# Patient Record
Sex: Male | Born: 1970
Health system: Southern US, Community
[De-identification: ages and names within clinical notes are randomized; demographics above are authoritative.]

## PROBLEM LIST (undated history)

## (undated) DIAGNOSIS — T7840XA Allergy, unspecified, initial encounter: Secondary | ICD-10-CM

## (undated) HISTORY — DX: Allergy, unspecified, initial encounter: T78.40XA

## (undated) HISTORY — PX: SHOULDER SURGERY: SHX246

---

## 2006-03-31 ENCOUNTER — Ambulatory Visit (HOSPITAL_BASED_OUTPATIENT_CLINIC_OR_DEPARTMENT_OTHER): Admission: RE | Admit: 2006-03-31 | Discharge: 2006-03-31 | Payer: Self-pay | Admitting: Orthopaedic Surgery

## 2014-08-06 ENCOUNTER — Ambulatory Visit (INDEPENDENT_AMBULATORY_CARE_PROVIDER_SITE_OTHER): Payer: 59 | Admitting: Family Medicine

## 2014-08-06 ENCOUNTER — Ambulatory Visit (INDEPENDENT_AMBULATORY_CARE_PROVIDER_SITE_OTHER): Payer: 59

## 2014-08-06 VITALS — BP 110/80 | HR 88 | Temp 98.7°F | Resp 18 | Ht 72.0 in | Wt 200.4 lb

## 2014-08-06 DIAGNOSIS — S92301A Fracture of unspecified metatarsal bone(s), right foot, initial encounter for closed fracture: Secondary | ICD-10-CM | POA: Diagnosis not present

## 2014-08-06 DIAGNOSIS — S99921A Unspecified injury of right foot, initial encounter: Secondary | ICD-10-CM | POA: Diagnosis not present

## 2014-08-06 DIAGNOSIS — M25571 Pain in right ankle and joints of right foot: Secondary | ICD-10-CM | POA: Diagnosis not present

## 2014-08-06 MED ORDER — HYDROCODONE-ACETAMINOPHEN 5-325 MG PO TABS: 1.0000 | ORAL_TABLET | Freq: Four times a day (QID) | ORAL | Status: AC | PRN

## 2014-08-06 NOTE — Patient Instructions (Addendum)
Rest, ice, elevate, compress with ace bandage - try to ice as freq as possible for the next 3 days - hopefully you can get into ortho early this week.  Ice 20 minutes every 2 hours at least.  You can use the crutches and not put weight on your foot.  When you go out, put on the short leg walking boot and you can put a small amount of weight on your foot as long as it is not to painful - listen to your body - we will have you rechecked with ortho this week.    Metatarsal Fracture  with Rehab A metatarsal fracture is a break (fracture) of one of the bones of the mid-foot (metatarsal bones). The metatarsal bones are responsible for maintaining the arch of the foot. There are three classifications of metatarsal fractures: dancer's fractures, Jones fractures, and stress fractures. A dancer's fracture is when a piece of bone is pulled off by a ligament or tendon (avulsion fracture) of the outer part of the foot (fifth metatarsal), near the joint with the ankle bones. A Jones fracture occurs in the middle of the fifth metatarsal. These fractures have limited ability to heal. A stress fracture occurs when the bone is slowly injured faster than it can repair itself. SYMPTOMS   Sharp pain, especially with standing or walking.  Tenderness, swelling, and later bruising (contusion) of the foot.  Numbness or paralysis from swelling in the foot, causing pressure on the blood vessels or nerves (uncommon). CAUSES  Fractures occur when a force is placed on the bone that is greater than it can handle. Common causes of injury include:  Direct hit (trauma) to the foot.  Twisting injury to the foot or ankle.  Landing on the foot and ankle in an improper position. RISK INCRESES WITH:  Participation in contact sports, sports that require jumping and landing, or sports in which cleats are worn and sliding occurs.  Previous foot or ankle sprains or dislocations.  Repeated injury to any joint in the foot.  Poor  strength and flexibility. PREVENTION  Warm up and stretch properly before an activity.  Allow for adequate recovery between workouts.  Maintain physical fitness in:  Strength, flexibility, and endurance.  Cardiovascular fitness.  When participating in jumping or contact sports, protect joints with supportive devices, such as wrapped elastic bandages, tape, braces, or high-top athletic shoes.  Wear properly fitted and padded protective equipment. PROGNOSIS If treated properly, metatarsal fractures usually heal well. Jones fractures have a higher risk of the bone failing to heal (nonunion). Sometimes, surgery is needed to heal Jones fractures.  RELATED COMPLICATIONS   Nonunion.  Fracture heals in a poor position (malunion).  Long-term (chronic) pain, stiffness, or swelling of the foot.  Excessive bleeding in the foot or at the dislocation site, causing pressure and injury to nerves and blood vessels (rare).  Unstable or arthritic joint following repeated injury or delayed treatment. TREATMENT  Treatment first involves the use of ice and medicine, to reduce pain and inflammation. If the bone fragments are out of alignment (displaced), then immediate realigning of the bones (reduction) is required. Fractures that cannot be realigned by hand, or where the bones protrude through the skin (open), may require surgery to hold the fracture in place with screws, pins, and plates. After the bones are in proper alignment, the foot and ankle must be restrained for 6 or more weeks. Restraint allows healing to occur. After restraint, it is important to perform strengthening and stretching  exercises to help regain strength and a full range of motion. These exercises may be completed at home or with a therapist. A stiff-soled shoe and arch support (orthotic) may be required when first returning to sports. MEDICATION   If pain medicine is needed, nonsteroidal anti-inflammatory medicines (NSAIDS), or  other minor pain relievers, are often advised.  Do not take pain medicine for 7 days before surgery.  Only take over-the-counter or prescription medicines for pain, fever, or discomfort as directed by your caregiver. COLD THERAPY  Cold treatment (icing) should be applied for 10 to 15 minutes every 2 to 3 hours for inflammations and pain, and immediately after activity that aggravates your symptoms. Use ice packs or ice massage. SEEK MEDICAL CARE IF:  Pain, tenderness, or swelling gets worse, despite treatment.  You experience pain, numbness, or coldness in the foot.  Blue, gray, or dark color appears in the toenails.  You or your child has an oral temperature above 102 F (38.9 C).  You have increased pain, swelling, and redness.  You have drainage of fluids or bleeding in the affected area.  New, unexplained symptoms develop. (Drugs used in treatment may produce side effects.) EXERCISES RANGE OF MOTION (ROM) AND STRETCHING EXERCISES - Metatarsal Fracture (including Jones and Dancer's Fractures) These exercises may help you when beginning to rehabilitate your injury. Your symptoms may resolve with or without further involvement from your physician, physical therapist, or athletic trainer. While completing these exercises, remember:   Restoring tissue flexibility helps normal motion to return to the joints. This allows healthier, less painful movement and activity.  An effective stretch should be held for at least 30 seconds. A stretch should never be painful. You should only feel a gentle lengthening or release in the stretched. RANGE OF MOTION - Dorsi/Plantar Flexion  While sitting with your right / left knee straight, draw the top of your foot upwards, by flexing your ankle. Then reverse the motion, pointing your toes downward.  Hold each position for __________ seconds.  After completing your first set of exercises, repeat this exercise with your knee bent. Repeat __________  times. Complete this exercise __________ times per day.  RANGE OF MOTION - Ankle Alphabet  Imagine your right / left big toe is a pen.  Keeping your hip and knee still, write out the entire alphabet with your "pen." Make the letters as large as you can, without increasing any discomfort. Repeat __________ times. Complete this exercise __________ times per day.  STRETCH - Gastroc, Standing  Place your hands on a wall.  Extend your right / left leg behind you, keeping the front knee somewhat bent.  Slightly point your toes inward on your back foot.  Keeping your right / left heel on the floor and your knee straight, shift your weight toward the wall, not allowing your back to arch.  You should feel a gentle stretch in the right / left calf. Hold this position for __________ seconds. Repeat __________ times. Complete this stretch __________ times per day. STRETCH - Soleus, Standing   Place your hands on a wall.  Extend your right / left leg behind you, keeping the other knee somewhat bent.  Slightly point your toes inward on your back foot.  Keep your right / left heel on the floor, bend your back knee, and slightly shift your weight over the back leg so that you feel a gentle stretch deep in your back calf.  Hold this position for __________ seconds. Repeat __________ times.  Complete this stretch __________ times per day. STRENGTHENING EXERCISES - Metatarsal Fracture (Including Jones and Dancer's Fractures) These exercises may help you when beginning to rehabilitate your injury. They may resolve your symptoms with or without further involvement from your physician, physical therapist, or athletic trainer. While completing these exercises, remember:   Muscles can gain both the endurance and the strength needed for everyday activities through controlled exercises.  Complete these exercises as instructed by your physician, physical therapist or athletic trainer. Increase the resistance  and repetitions only as guided by your caregiver. STRENGTH - Dorsiflexors  Secure a rubber exercise band or tubing to a fixed object (table, pole) and loop the other end around your right / left foot.  Sit on the floor facing the fixed object. The band should be slightly tense when your foot is relaxed.  Slowly draw your foot back toward you, using your ankle and toes.  Hold this position for __________ seconds. Slowly release the tension in the band and return your foot to the starting position. Repeat __________ times. Complete this exercise __________ times per day.  STRENGTH - Plantar-flexors   Sit with your right / left leg extended. Holding onto both ends of a rubber exercise band or tubing, loop it around the ball of your foot. Keep a slight tension in the band.  Slowly push your toes away from you, pointing them downward.  Hold this position for __________ seconds. Return slowly, controlling the tension in the band. Repeat __________ times. Complete this exercise __________ times per day.  STRENGTH - Plantar-flexors  Stand with your feet shoulder width apart. Steady yourself with a wall or table, using as little support as needed.  Keeping your weight evenly spread over the width of your feet, rise up on your toes.*  Hold this position for __________ seconds. Repeat __________ times. Complete this exercise __________ times per day.  *If this is too easy, shift your weight toward your right / left leg until you feel challenged. Ultimately, you may be asked to do this exercise while standing on your right / left foot only. STRENGTH - Towel Curls  Sit in a chair, on a non-carpeted surface.  Place your foot on a towel, keeping your heel on the floor.  Pull the towel toward your heel only by curling your toes. Keep your heel on the floor.  If instructed by your physician, physical therapist, or athletic trainer, weight may be added at the end of the towel. Repeat __________  times. Complete this exercise __________ times per day. STRENGTH - Ankle Eversion  Secure one end of a rubber exercise band or tubing to a fixed object (table, pole). Loop the other end around your foot, just before your toes.  Place your fists between your knees. This will focus your strengthening at your ankle.  Drawing the band across your opposite foot, away from the pole, slowly, pull your little toe out and up. Make sure the band is positioned to resist the entire motion.  Hold this position for __________ seconds.  Have your muscles resist the band, as it slowly pulls your foot back to the starting position. Repeat __________ times. Complete this exercise __________ times per day.  STRENGTH - Ankle Inversion  Secure one end of a rubber exercise band or tubing to a fixed object (table, pole). Loop the other end around your foot, just before your toes.  Place your fists between your knees. This will focus your strengthening at your ankle.  Slowly, pull  your big toe up and in, making sure the band is positioned to resist the entire motion.  Hold this position for __________ seconds.  Have your muscles resist the band, as it slowly pulls your foot back to the starting position. Repeat __________ times. Complete this exercises __________ times per day.  Document Released: 02/03/2005 Document Revised: 04/28/2011 Document Reviewed: 05/19/2013 [facility] Patient Information 2015 [place], [place]. This information is not intended to replace advice given to you by your health care provider. Make sure you discuss any questions you have with your health care provider.

## 2014-08-06 NOTE — Progress Notes (Addendum)
Subjective:   This chart was scribed for Norberto Sorenson, MD by Andrew Au, ED Scribe. This patient was seen in room 6 and the patient's care was started at 1:57 PM.   Patient ID: Kurt Mathews, male    DOB: [birth year], 44 y.o.   MRN: [id number]  HPI   Chief Complaint  Patient presents with  . Foot Injury    right foot injury & swelling-happened today around 12:45. Stepped off a curb wrong & into a drain.   HPI Comments:  Kurt Mathews is a 44 y.o. male who presents to the [facility] complaining of right foot injury that occurred 1 hour ago. Pt states he twisted his ankle stepping off a curb. Pt has applied ice to ankle since injury, which has reduced the pain, but has not been able to bear weight to right foot. Pt and his wife has been seen by Dr. Berneice Heinrich at [facility] orthopedist for past shoulder injury. Pt plans to follow up with Dr. Jerl Santos.   Past Medical History  Diagnosis Date  . Allergy    No Known Allergies Prior to Admission medications   Medication Sig Start Date End Date Taking? Authorizing Provider  fexofenadine (ALLEGRA) 180 MG tablet Take 180 mg by mouth daily.   Yes Historical Provider, MD    Review of Systems  Constitutional: Positive for activity change. Negative for fever and appetite change.  Respiratory: Negative for cough and shortness of breath.   Cardiovascular: Positive for leg swelling. Negative for chest pain.  Gastrointestinal: Negative for abdominal pain.  Musculoskeletal: Positive for myalgias, arthralgias and gait problem.  Skin: Negative for color change, pallor and wound.  Neurological: Negative for weakness and numbness.  Hematological: Negative for adenopathy. Does not bruise/bleed easily.   Objective:  Physical Exam  Constitutional: He is oriented to person, place, and time. He appears well-developed and well-nourished. No distress.  HENT:  Head: Normocephalic and atraumatic.  Eyes: Conjunctivae and EOM are normal.  Neck:  Neck supple.  Cardiovascular: Normal rate.   Pulses:      Dorsalis pedis pulses are 2+ on the right side.       Posterior tibial pulses are 2+ on the right side.  Pulmonary/Chest: Effort normal.  Musculoskeletal: Normal range of motion.  Large amount erythema and edema over proximal 5th metatarsal.   Neurological: He is alert and oriented to person, place, and time.  Skin: Skin is warm and dry.  Psychiatric: He has a normal mood and affect. His behavior is normal.  Nursing note and vitals reviewed.  Filed Vitals:   08/06/14 1343  BP: 110/80  Pulse: 88  Temp: 98.7 F (37.1 C)  TempSrc: Oral  Resp: 18  Height: 6' (1.829 m)  Weight: 200 lb 6 oz (90.89 kg)  SpO2: 99%   [facility] reading (PRIMARY) by Dr. Clelia Croft. Right foot Xray: displaced avulsion of proximal 5th metatarsal styloid. It does appear that styloid is displaced by 40mm Right ankle xray. Large amount of soft tissue evident.    Dg Ankle Complete Right  08/06/2014   CLINICAL DATA:  Rolling injury after stepping into hole  EXAM: RIGHT ANKLE - COMPLETE 3+ VIEW  COMPARISON:  None.  FINDINGS: Frontal, oblique, and lateral views were obtained. There is a displaced fracture at the base of the fifth metatarsal. There is soft tissue swelling. No other fracture apparent. No joint effusion. The ankle mortise appears intact.  IMPRESSION: Fracture of the base of the fifth metatarsal with proximal displacement  of the proximal fragment from the remainder of the fifth metatarsal. Soft tissue swelling. Ankle mortise appears intact. No other fracture apparent.   Electronically Signed   By: Bretta Bang III M.D.   On: 08/06/2014 14:55   Dg Foot Complete Right  08/06/2014   CLINICAL DATA:  Right foot pain after stepping in a hole outside today, resulting in an injury.  EXAM: RIGHT FOOT COMPLETE - 3+ VIEW  COMPARISON:  None.  FINDINGS: Transverse fracture of the base of the fifth metatarsal with proximal and lateral displacement of the proximal fragment.  Overlying soft tissue swelling.  IMPRESSION: Fracture of the base of the fifth metatarsal.   Electronically Signed   By: Beckie Salts M.D.   On: 08/06/2014 14:54    Assessment & Plan:   Proximal 5th metatarsal styloid avulsion but displaced  Will refer to orthopedics as it appears that they styloid fracture is displaced more than 3mm.  In the mean time will place in a short leg walking boot with crutches - . RICE and recheck with ortho this wk - pt saw Dr. Jerl Santos recently so would like to f/u w/ him.   1. Pain in joint, ankle and foot, right   2. Injury of right foot including toes, initial encounter   3. Fracture of fifth metatarsal bone, right, closed, initial encounter   4. Avulsion fracture of metatarsal bone, right, closed, initial encounter     Orders Placed This Encounter  Procedures  . DG Foot Complete Right    Standing Status: Future     Number of Occurrences: 1     Standing Expiration Date: 08/06/2015    Order Specific Question:  Reason for Exam (SYMPTOM  OR DIAGNOSIS REQUIRED)    Answer:  inversion injury with huge amount of swelling over proximal 5th metatarsal    Order Specific Question:  Preferred imaging location?    Answer:  External  . DG Ankle Complete Right    Standing Status: Future     Number of Occurrences: 1     Standing Expiration Date: 08/06/2015    Order Specific Question:  Reason for Exam (SYMPTOM  OR DIAGNOSIS REQUIRED)    Answer:  swelling and pain immed anterior and distal to lateral malleolus after inversion injury    Order Specific Question:  Preferred imaging location?    Answer:  External  . Ambulatory referral to Orthopedic Surgery    Referral Priority:  Urgent    Referral Type:  Surgical    Referral Reason:  Specialty Services Required    Requested Specialty:  Orthopedic Surgery    Number of Visits Requested:  1    Meds ordered this encounter  Medications  . fexofenadine (ALLEGRA) 180 MG tablet    Sig: Take 180 mg by mouth daily.  Marland Kitchen  HYDROcodone-acetaminophen (NORCO/VICODIN) 5-325 MG per tablet    Sig: Take 1 tablet by mouth every 6 (six) hours as needed for moderate pain.    Dispense:  30 tablet    Refill:  0    I personally performed the services described in this documentation, which was scribed in my presence. The recorded information has been reviewed and considered, and addended by me as needed.  Norberto Sorenson, MD MPH

## 2014-08-07 NOTE — Progress Notes (Signed)
Looks like pt has appt tomorrow w/ Dr. Nolen Mu. Don't need to try pt again. Thanks.

## 2014-08-08 DIAGNOSIS — S92353A Displaced fracture of fifth metatarsal bone, unspecified foot, initial encounter for closed fracture: Secondary | ICD-10-CM | POA: Insufficient documentation

## 2014-10-05 ENCOUNTER — Encounter: Payer: Self-pay | Admitting: *Deleted

## 2015-11-17 IMAGING — CR DG ANKLE COMPLETE 3+V*R*
3 series · 3 of 3 positions shown · non-contrast
Comparison: None.

CLINICAL DATA: Rolling injury after stepping into hole

EXAM:
RIGHT ANKLE - COMPLETE 3+ VIEW

[AP]
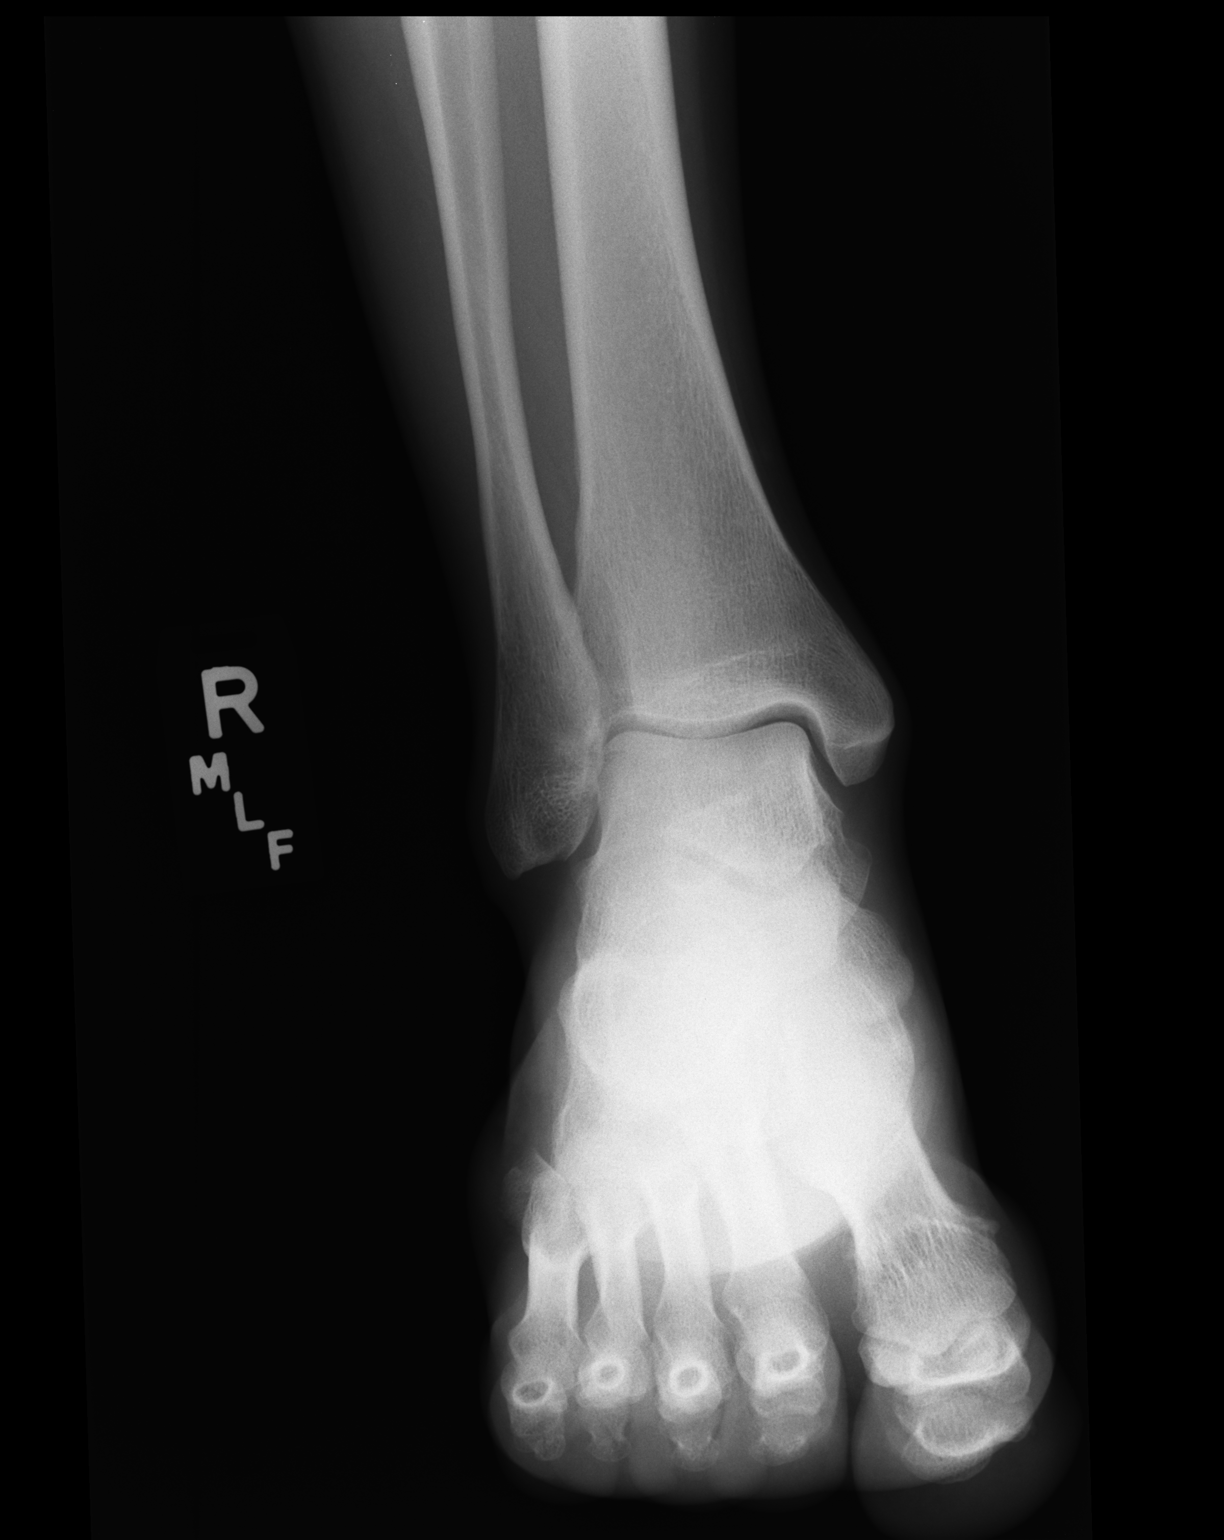

[ap obl int rot]
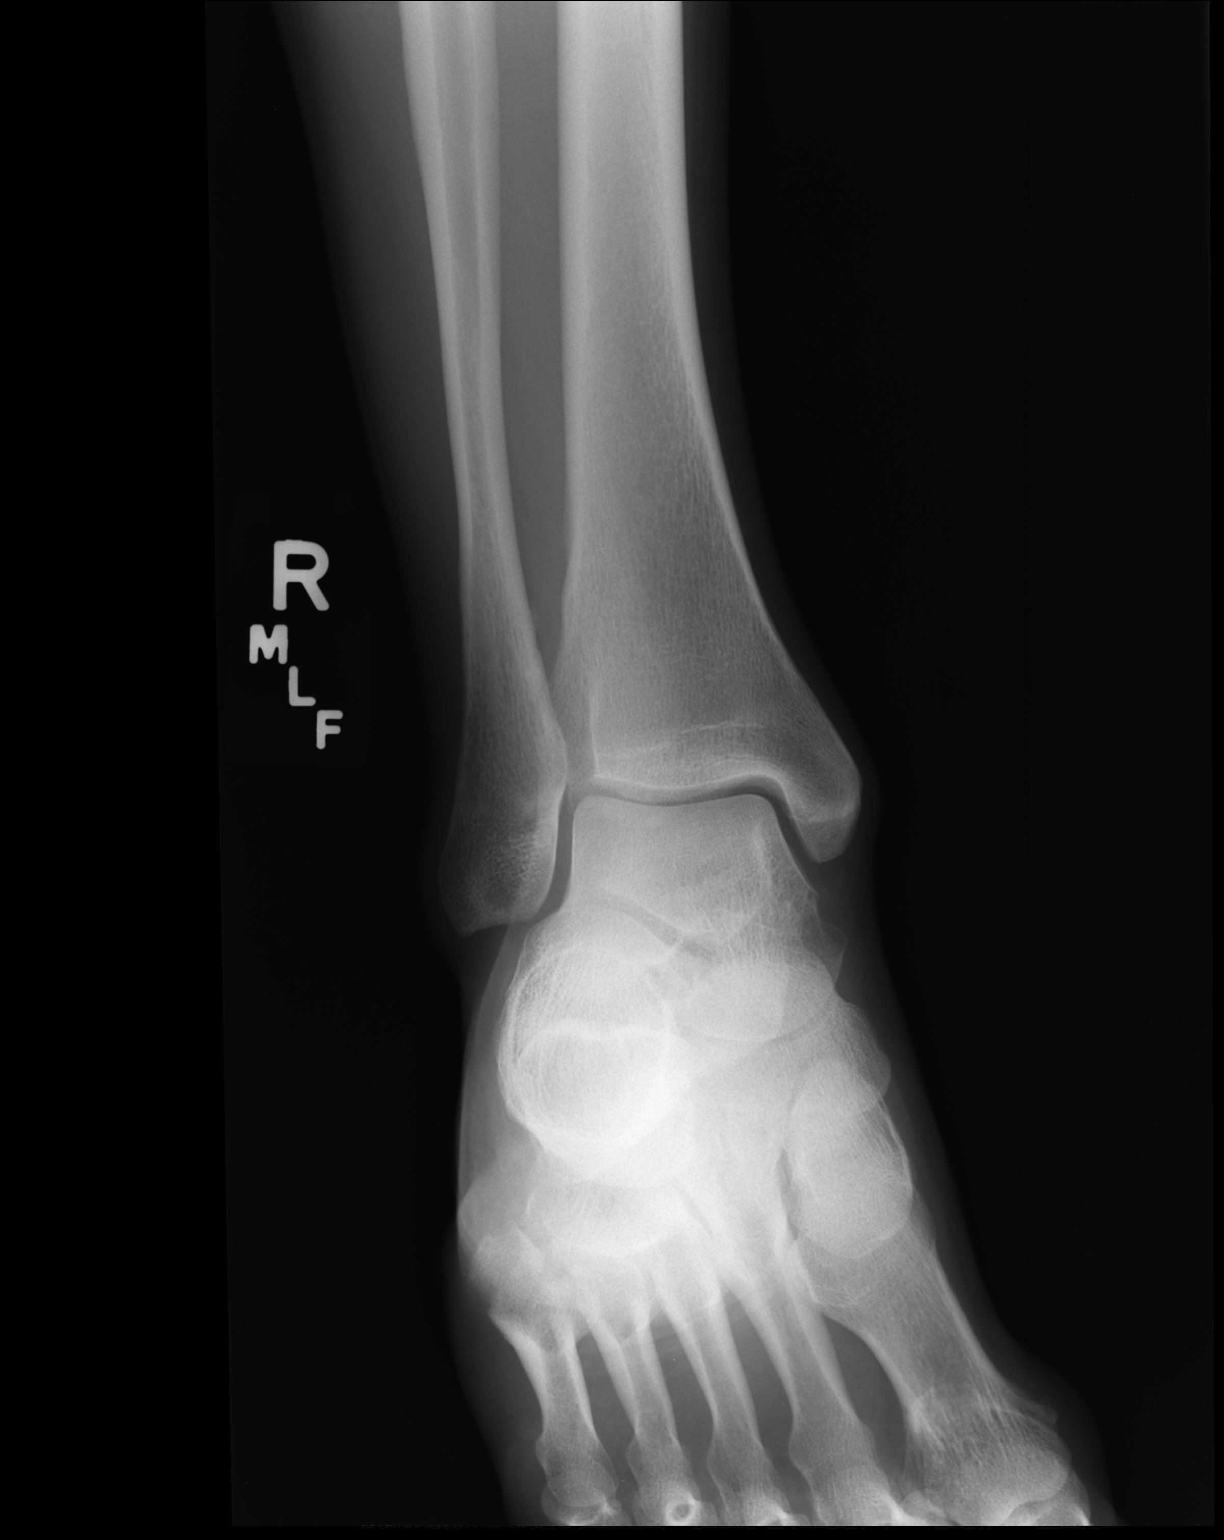

[ap obl ext rot]
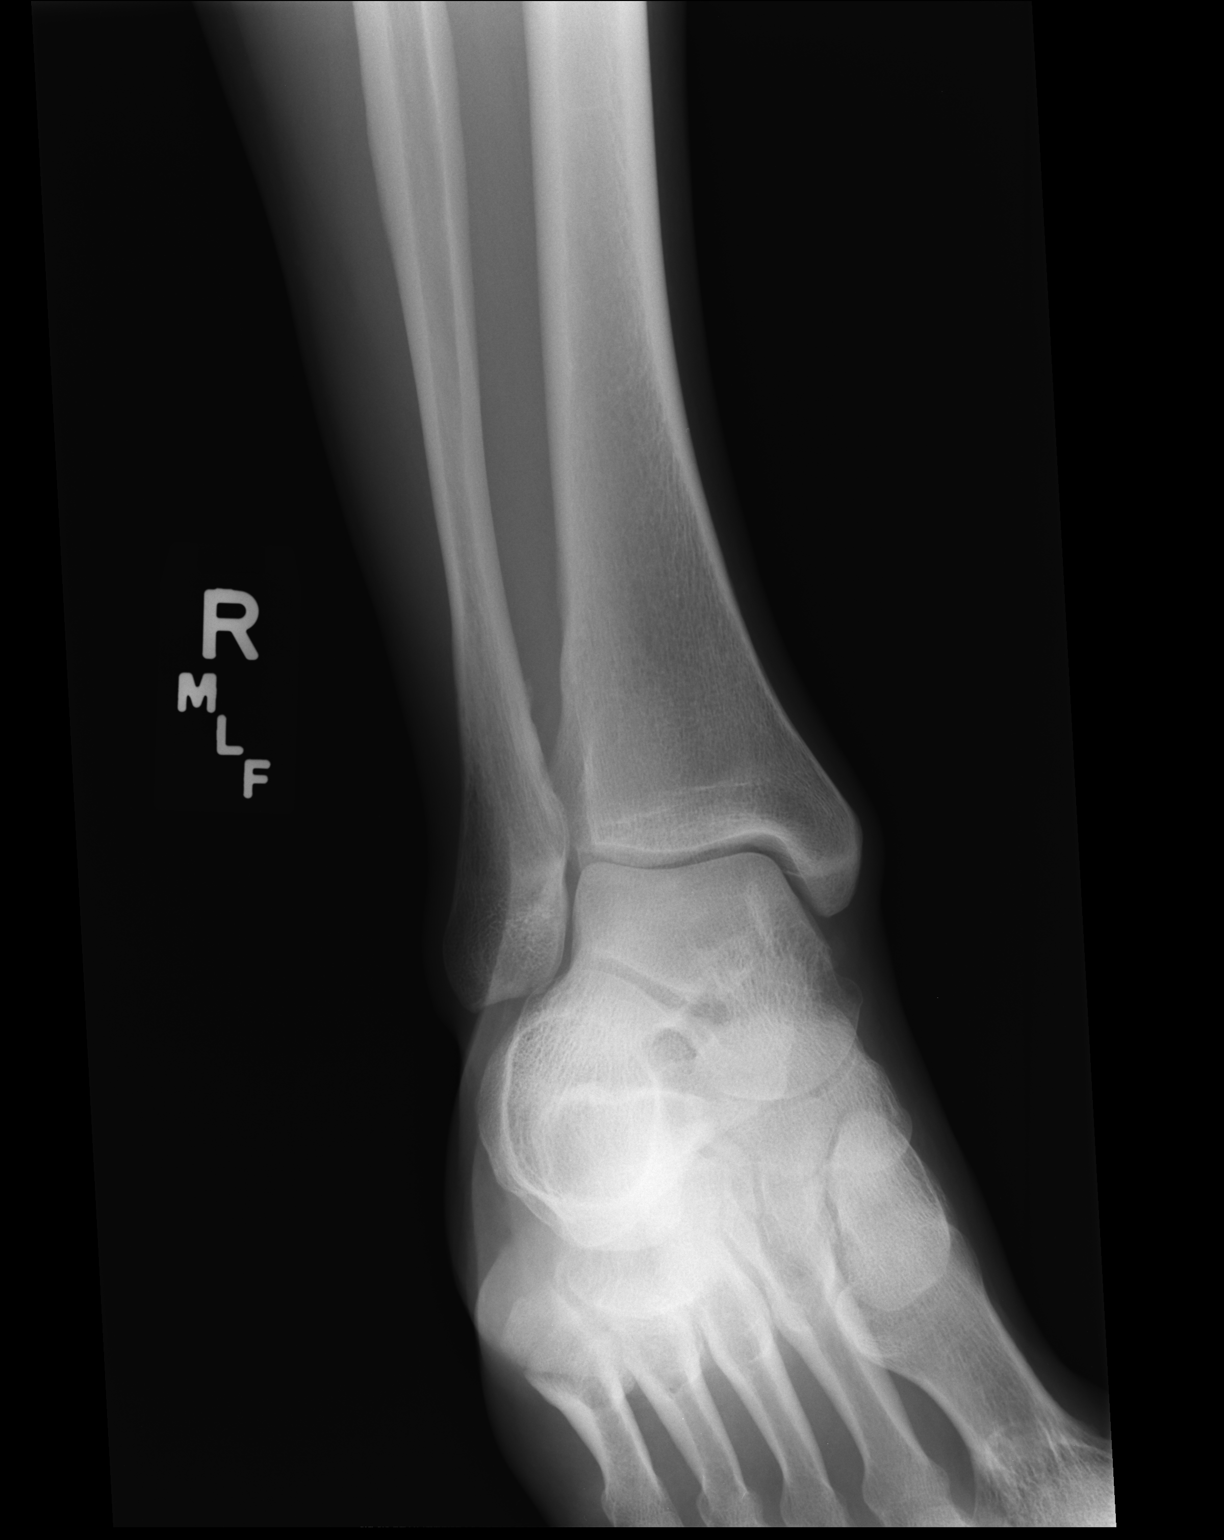

[3 of 3 positions shown; findings below may reference images not displayed]

FINDINGS: Frontal, oblique, and lateral views were obtained. There is a
displaced fracture at the base of the fifth metatarsal. There is
soft tissue swelling. No other fracture apparent. No joint effusion.
The ankle mortise appears intact.
IMPRESSION: Fracture of the base of the fifth metatarsal with proximal
displacement of the proximal fragment from the remainder of the
fifth metatarsal. Soft tissue swelling. Ankle mortise appears
intact. No other fracture apparent.

## 2019-02-18 DIAGNOSIS — R5381 Other malaise: Secondary | ICD-10-CM | POA: Diagnosis not present

## 2019-02-18 DIAGNOSIS — Z20828 Contact with and (suspected) exposure to other viral communicable diseases: Secondary | ICD-10-CM | POA: Diagnosis not present

## 2019-09-29 DIAGNOSIS — L989 Disorder of the skin and subcutaneous tissue, unspecified: Secondary | ICD-10-CM | POA: Diagnosis not present

## 2019-12-21 DIAGNOSIS — B078 Other viral warts: Secondary | ICD-10-CM | POA: Diagnosis not present

## 2019-12-21 DIAGNOSIS — L720 Epidermal cyst: Secondary | ICD-10-CM | POA: Diagnosis not present

## 2020-02-22 DIAGNOSIS — Z20822 Contact with and (suspected) exposure to covid-19: Secondary | ICD-10-CM | POA: Diagnosis not present

## 2020-02-23 DIAGNOSIS — Z20822 Contact with and (suspected) exposure to covid-19: Secondary | ICD-10-CM | POA: Diagnosis not present

## 2021-02-08 ENCOUNTER — Ambulatory Visit
Admission: EM | Admit: 2021-02-08 | Discharge: 2021-02-08 | Disposition: A | Discharge status: Home / Self Care | Payer: BC Managed Care – PPO | Attending: Physician Assistant | Admitting: Physician Assistant

## 2021-02-08 ENCOUNTER — Other Ambulatory Visit: Payer: Self-pay

## 2021-02-08 DIAGNOSIS — Z20828 Contact with and (suspected) exposure to other viral communicable diseases: Secondary | ICD-10-CM

## 2021-02-08 DIAGNOSIS — J111 Influenza due to unidentified influenza virus with other respiratory manifestations: Secondary | ICD-10-CM | POA: Diagnosis not present

## 2021-02-08 MED ORDER — OSELTAMIVIR PHOSPHATE 75 MG PO CAPS: 75.0000 mg | ORAL_CAPSULE | Freq: Two times a day (BID) | ORAL | 0 refills | Status: AC

## 2021-02-08 NOTE — ED Triage Notes (Signed)
Patient states his symptoms started last night out of the blue.  Patient states he had a fever of 101.0 last night.   Patient states he is sweating very hard.  Patient states he has a very bad headache.  Patient states that their grandson had the flu last week and he may have been exposed.  Patient states that he took Tylenol and Forensic psychologist

## 2021-02-08 NOTE — Discharge Instructions (Signed)
Return if any problems.

## 2021-02-08 NOTE — ED Provider Notes (Signed)
[facility]    CSN: [id number] Arrival date & time: 02/08/21  0901      History   Chief Complaint Chief Complaint  Patient presents with   Cough    Congestion, cough, headache and fever    HPI Kurt Mathews is a 50 y.o. male.   Pt exposed to the flu  The history is provided by the patient. No language interpreter was used.  Cough Cough characteristics:  Non-productive Sputum characteristics:  Nondescript Severity:  Moderate Onset quality:  Gradual Duration:  2 days Timing:  Constant Progression:  Worsening Chronicity:  New Smoker: no   Context: sick contacts   Relieved by:  Nothing Worsened by:  Nothing Ineffective treatments:  None tried Associated symptoms: no fever    Past Medical History:  Diagnosis Date   Allergy     Patient Active Problem List   Diagnosis Date Noted   Right fifth metatarsal tubercle avulsion fracture with minimal displacement 08/08/2014    Past Surgical History:  Procedure Laterality Date   SHOULDER SURGERY Left        Home Medications    Prior to Admission medications   Medication Sig Start Date End Date Taking? Authorizing Provider  oseltamivir (TAMIFLU) 75 MG capsule Take 1 capsule (75 mg total) by mouth 2 (two) times daily for 5 days. 02/08/21 02/13/21 Yes Elson Areas, PA-C  fexofenadine (ALLEGRA) 180 MG tablet Take 180 mg by mouth daily.    [provider]  HYDROcodone-acetaminophen (NORCO/VICODIN) 5-325 MG per tablet Take 1 tablet by mouth every 6 (six) hours as needed for moderate pain. 08/06/14   Sherren Mocha, MD    Family History Family History  Problem Relation Age of Onset   Hypertension Mother    Hypertension Father    Hyperlipidemia Father    Heart disease Maternal Grandmother    Stroke Maternal Grandfather     Social History Social History   Tobacco Use   Smoking status: Former   Smokeless tobacco: Current    Types: Associate Professor Use: Never used  Substance Use  Topics   Alcohol use: Yes    Alcohol/week: 3.0 standard drinks    Types: 3 Standard drinks or equivalent per week    Comment: occassionally   Drug use: No     Allergies   Patient has no known allergies.   Review of Systems Review of Systems  Constitutional:  Negative for fever.  Respiratory:  Positive for cough.   All other systems reviewed and are negative.   Physical Exam Triage Vital Signs ED Triage Vitals  Enc Vitals Group     BP 02/08/21 0957 120/86     Pulse Rate 02/08/21 0957 (!) 104     Resp 02/08/21 0957 20     Temp 02/08/21 0957 99 F (37.2 C)     Temp src --      SpO2 02/08/21 0957 92 %     Weight --      Height --      Head Circumference --      Peak Flow --      Pain Score 02/08/21 0955 8     Pain Loc --      Pain Edu? --      Excl. in GC? --    No data found.  Updated Vital Signs BP 120/86 (BP Location: Right Arm)    Pulse (!) 104    Temp 99 F (37.2 C)  Resp 20    SpO2 92%   Visual Acuity Right Eye Distance:   Left Eye Distance:   Bilateral Distance:    Right Eye Near:   Left Eye Near:    Bilateral Near:     Physical Exam Vitals reviewed.  Constitutional:      Appearance: Normal appearance.  HENT:     Right Ear: Tympanic membrane normal.     Left Ear: Tympanic membrane normal.     Mouth/Throat:     Mouth: Mucous membranes are moist.  Cardiovascular:     Rate and Rhythm: Normal rate.  Pulmonary:     Effort: Pulmonary effort is normal.  Abdominal:     General: Abdomen is flat.  Musculoskeletal:        General: Normal range of motion.  Skin:    General: Skin is warm.  Neurological:     General: No focal deficit present.     Mental Status: He is alert.  Psychiatric:        Mood and Affect: Mood normal.     UC Treatments / Results  Labs (all labs ordered are listed, but only abnormal results are displayed) Labs Reviewed  COVID-19, FLU A+B NAA    EKG   Radiology No results found.  Procedures Procedures  (including critical care time)  Medications Ordered in UC Medications - No data to display  Initial Impression / Assessment and Plan / UC Course  I have reviewed the triage vital signs and the nursing notes.  Pertinent labs & imaging results that were available during my care of the patient were reviewed by me and considered in my medical decision making (see chart for details).     MDM:  Pt request rx for tamiflu.   Final Clinical Impressions(s) / UC Diagnoses   Final diagnoses:  Exposure to the flu  Influenza with respiratory manifestation     Discharge Instructions      Return if any problems.    ED Prescriptions     Medication Sig Dispense Auth. Provider   oseltamivir (TAMIFLU) 75 MG capsule Take 1 capsule (75 mg total) by mouth 2 (two) times daily for 5 days. 10 capsule Elson Areas, [place]      PDMP not reviewed this encounter. An After Visit Summary was printed and given to the patient.    Elson Areas, [place] 02/08/21 0630

## 2021-02-09 LAB — COVID-19, FLU A+B NAA
Influenza A, NAA: NOT DETECTED
Influenza B, NAA: NOT DETECTED
SARS-CoV-2, NAA: NOT DETECTED
